# Patient Record
Sex: Female | Born: 1990 | Hispanic: No | Marital: Single | Smoking: Never smoker
Health system: Southern US, Community
[De-identification: ages and names within clinical notes are randomized; demographics above are authoritative.]

## PROBLEM LIST (undated history)

## (undated) DIAGNOSIS — F419 Anxiety disorder, unspecified: Secondary | ICD-10-CM

---

## 2016-07-12 ENCOUNTER — Emergency Department (HOSPITAL_COMMUNITY): Payer: Self-pay

## 2016-07-12 ENCOUNTER — Encounter (HOSPITAL_COMMUNITY): Payer: Self-pay

## 2016-07-12 ENCOUNTER — Emergency Department (HOSPITAL_COMMUNITY)
Admission: EM | Admit: 2016-07-12 | Discharge: 2016-07-12 | Disposition: A | Discharge status: Home / Self Care | Payer: Self-pay | Attending: Emergency Medicine | Admitting: Emergency Medicine

## 2016-07-12 DIAGNOSIS — R0789 Other chest pain: Secondary | ICD-10-CM | POA: Insufficient documentation

## 2016-07-12 DIAGNOSIS — Z79899 Other long term (current) drug therapy: Secondary | ICD-10-CM | POA: Insufficient documentation

## 2016-07-12 HISTORY — DX: Anxiety disorder, unspecified: F41.9

## 2016-07-12 LAB — RAPID URINE DRUG SCREEN, HOSP PERFORMED
Amphetamines: NOT DETECTED
Barbiturates: NOT DETECTED
Benzodiazepines: NOT DETECTED
COCAINE: NOT DETECTED
OPIATES: NOT DETECTED
Tetrahydrocannabinol: NOT DETECTED

## 2016-07-12 LAB — BASIC METABOLIC PANEL
Anion gap: 9 (ref 5–15)
BUN: 7 mg/dL (ref 6–20)
CALCIUM: 9.2 mg/dL (ref 8.9–10.3)
CO2: 22 mmol/L (ref 22–32)
CREATININE: 0.89 mg/dL (ref 0.44–1.00)
Chloride: 107 mmol/L (ref 101–111)
GFR calc Af Amer: >60 mL/min (ref >60)
GFR calc non Af Amer: >60 mL/min (ref >60)
GLUCOSE: 119 mg/dL — AB (ref 65–99)
Potassium: 3.9 mmol/L (ref 3.5–5.1)
Sodium: 138 mmol/L (ref 135–145)

## 2016-07-12 LAB — URINALYSIS, ROUTINE W REFLEX MICROSCOPIC
BILIRUBIN URINE: NEGATIVE
Glucose, UA: NEGATIVE mg/dL
HGB URINE DIPSTICK: NEGATIVE
Ketones, ur: NEGATIVE mg/dL
Leukocytes, UA: NEGATIVE
NITRITE: NEGATIVE
PROTEIN: NEGATIVE mg/dL
Specific Gravity, Urine: 1.015 (ref 1.005–1.030)
pH: 6 (ref 5.0–8.0)

## 2016-07-12 LAB — CBC
HCT: 42.5 % (ref 36.0–46.0)
HEMOGLOBIN: 14.1 g/dL (ref 12.0–15.0)
MCH: 27.5 pg (ref 26.0–34.0)
MCHC: 33.2 g/dL (ref 30.0–36.0)
MCV: 83 fL (ref 78.0–100.0)
Platelets: 229 10*3/uL (ref 150–400)
RBC: 5.12 MIL/uL — ABNORMAL HIGH (ref 3.87–5.11)
RDW: 13.3 % (ref 11.5–15.5)
WBC: 6.7 10*3/uL (ref 4.0–10.5)

## 2016-07-12 LAB — I-STAT TROPONIN, ED: TROPONIN I, POC: 0 ng/mL (ref 0.00–0.08)

## 2016-07-12 LAB — D-DIMER, QUANTITATIVE (NOT AT ARMC): D DIMER QUANT: 1.15 ug{FEU}/mL — AB (ref 0.00–0.50)

## 2016-07-12 LAB — POC URINE PREG, ED: Preg Test, Ur: NEGATIVE

## 2016-07-12 MED ORDER — HYDROXYZINE HCL 25 MG PO TABS: 25.0000 mg | ORAL_TABLET | Freq: Three times a day (TID) | ORAL | 0 refills | Status: AC | PRN

## 2016-07-12 MED ORDER — IOPAMIDOL (ISOVUE-370) INJECTION 76%
INTRAVENOUS | Status: AC
  Administered 2016-07-12: 65 mL via INTRAVENOUS
  Filled 2016-07-12: qty 100

## 2016-07-12 MED ORDER — PANTOPRAZOLE SODIUM 20 MG PO TBEC: 20.0000 mg | DELAYED_RELEASE_TABLET | Freq: Every day | ORAL | 0 refills | Status: AC

## 2016-07-12 MED ORDER — SODIUM CHLORIDE 0.9 % IV BOLUS (SEPSIS)
500.0000 mL | Freq: Once | INTRAVENOUS | Status: AC
  Administered 2016-07-12: 500 mL via INTRAVENOUS

## 2016-07-12 MED ORDER — GI COCKTAIL ~~LOC~~
30.0000 mL | Freq: Once | ORAL | Status: AC
  Administered 2016-07-12: 30 mL via ORAL
  Filled 2016-07-12: qty 30

## 2016-07-12 NOTE — ED Notes (Signed)
Ambulated on pulse oximetry.  Ambulated with steady gait, pulse 94, O2 saturation 99%.

## 2016-07-12 NOTE — ED Provider Notes (Signed)
[facility]-EMERGENCY DEPT Provider Note   CSN: [id number] Arrival date & time: 07/12/16  4098     History   Chief Complaint Chief Complaint  Patient presents with  . Chest Pain    HPI Charlene Ramirez is a 25 y.o. female.  HPI   Patient is a 25 year old female who presents emergency Department with right substernal chest pain described as pressure and squeezing that began last night at 10 PM, while at rest. Pain is severe, rated 8 out of 10, and waxed and waned until 2 AM when it became constant and severe with associated sweats, "shakiness" and frontal headache. She also reports intermittent nausea for the first 2 hours of pain and shortness of breath. She did become anxious after the pain became more constant. She denies any history of smoking. She denies cough, wheeze, fever, chills, orthopnea, PND, palpitations, lower extremity edema, near syncope, numbness or tingling.  She reports pain is not worse with inspiration, exertion, or positional changes.  She reports only past medical history of arthritis and is currently taking oral birth control. She denies any family history of cardiac disease.  Past Medical History:  Diagnosis Date  . Anxiety     There are no active problems to display for this patient.   History reviewed. No pertinent surgical history.  OB History    No data available       Home Medications    Prior to Admission medications   Medication Sig Start Date End Date Taking? Authorizing Provider  PRESCRIPTION MEDICATION Take 1 tablet by mouth daily. Birth control tablet ( 28 day pack)   Yes Historical Provider, MD  hydrOXYzine (ATARAX/VISTARIL) 25 MG tablet Take 1 tablet (25 mg total) by mouth every 8 (eight) hours as needed for anxiety. 07/12/16   Danelle Berry, PA-C  pantoprazole (PROTONIX) 20 MG tablet Take 1 tablet (20 mg total) by mouth daily. 07/12/16   Danelle Berry, PA-C    Family History History reviewed. No pertinent family history.  Social History Social  History  Substance Use Topics  . Smoking status: Never Smoker  . Smokeless tobacco: Never Used  . Alcohol use No     Allergies   Review of patient's allergies indicates no known allergies.   Review of Systems Review of Systems  All other systems reviewed and are negative.    Physical Exam Updated Vital Signs BP 116/76 (BP Location: Left Arm)   Pulse 84   Temp 98 F (36.7 C) (Oral)   Resp 25   Ht 5\' 9"  (1.753 m)   Wt 93 kg   LMP 05/19/2016 (Within Weeks)   SpO2 98%   BMI 30.27 kg/m   Physical Exam  Constitutional: She is oriented to person, place, and time. She appears well-developed and well-nourished.  Non-toxic appearance. She does not have a sickly appearance. She does not appear ill. No distress.  HENT:  Head: Normocephalic and atraumatic.  Nose: Nose normal.  Mouth/Throat: Oropharynx is clear and moist. No oropharyngeal exudate.  Eyes: Conjunctivae and EOM are normal. Pupils are equal, round, and reactive to light. Right eye exhibits no discharge. Left eye exhibits no discharge. No scleral icterus.  Neck: Normal range of motion. Neck supple. No JVD present. No tracheal deviation present. No thyromegaly present.  Cardiovascular: Normal rate, regular rhythm, normal heart sounds and intact distal pulses.  Exam reveals no gallop and no friction rub.   No murmur heard. Pulses:      Radial pulses are 2+ on the right side,  and 2+ on the left side.       Dorsalis pedis pulses are 2+ on the right side, and 2+ on the left side.       Posterior tibial pulses are 2+ on the right side, and 2+ on the left side.    No lower extremity edema  Pulmonary/Chest: Effort normal and breath sounds normal. No accessory muscle usage or stridor. No tachypnea. No respiratory distress. She has no decreased breath sounds. She has no wheezes. She has no rhonchi. She has no rales. She exhibits no tenderness.  Abdominal: Soft. Bowel sounds are normal. She exhibits no distension and no mass.  There is no tenderness. There is no rebound and no guarding.  Musculoskeletal: Normal range of motion. She exhibits no edema or tenderness.  Lymphadenopathy:    She has no cervical adenopathy.  Neurological: She is alert and oriented to person, place, and time. She exhibits normal muscle tone. Coordination normal.  Skin: Skin is warm and dry. Capillary refill takes less than 2 seconds. No rash noted. She is not diaphoretic. No erythema. No pallor.  Psychiatric: She has a normal mood and affect. Her behavior is normal. Judgment and thought content normal.  Nursing note and vitals reviewed.    ED Treatments / Results  Labs (all labs ordered are listed, but only abnormal results are displayed) Labs Reviewed  BASIC METABOLIC PANEL - Abnormal; Notable for the following:       Result Value   Glucose, Bld 119 (*)    All other components within normal limits  CBC - Abnormal; Notable for the following:    RBC 5.12 (*)    All other components within normal limits  D-DIMER, QUANTITATIVE (NOT AT [facility]) - Abnormal; Notable for the following:    D-Dimer, Quant 1.15 (*)    All other components within normal limits  URINALYSIS, ROUTINE W REFLEX MICROSCOPIC (NOT AT [facility])  URINE RAPID DRUG SCREEN, HOSP PERFORMED  I-STAT TROPOININ, ED  POC URINE PREG, ED    EKG  EKG Interpretation  Date/Time:  Monday July 12 2016 06:38:39 EDT Ventricular Rate:  96 PR Interval:  148 QRS Duration: 86 QT Interval:  358 QTC Calculation: 452 R Axis:   96 Text Interpretation:  Normal sinus rhythm Rightward axis Borderline ECG No prior for comparison Confirmed by Wilkie Aye  MD, Toni Amend ([id number]) on 07/12/2016 6:38:12 AM Also confirmed by Wilkie Aye  MD, COURTNEY ([id number]), editor [place]  CCT, BEVERLY ([id number])  on 07/12/2016 7:10:54 AM       Radiology Dg Chest 2 View  Result Date: 07/12/2016 CLINICAL DATA:  Right-sided chest pain since last night. No reported cardiopulmonary history, nonsmoker. EXAM: CHEST  2 VIEW  COMPARISON:  None in PACs FINDINGS: The lungs are adequately inflated and clear. The heart and pulmonary vascularity are normal. The mediastinum is normal in width. There is no pleural effusion. The bony thorax exhibits no acute abnormality. IMPRESSION: There is no active cardiopulmonary disease. Electronically Signed   By: David  [place] M.D.   On: 07/12/2016 07:25   Ct Angio Chest Pe W And/or Wo Contrast  Result Date: 07/12/2016 CLINICAL DATA:  Right chest pain, shortness of breath EXAM: CT ANGIOGRAPHY CHEST WITH CONTRAST TECHNIQUE: Multidetector CT imaging of the chest was performed using the standard protocol during bolus administration of intravenous contrast. Multiplanar CT image reconstructions and MIPs were obtained to evaluate the vascular anatomy. CONTRAST:  65 mL Isovue 370 IV COMPARISON:  Chest radiographs dated 07/12/2016. FINDINGS: Cardiovascular: Satisfactory opacification of the  pulmonary arteries to the segmental level. No evidence of pulmonary embolism. Normal heart size. No pericardial effusion. Mediastinum/Nodes: No enlarged mediastinal, hilar, or axillary lymph nodes. Thyroid gland, trachea, and esophagus demonstrate no significant findings. Lungs/Pleura: Lungs are clear. No pleural effusion or pneumothorax. Upper Abdomen: No acute abnormality. Musculoskeletal: No chest wall abnormality. No acute or significant osseous findings. Review of the MIP images confirms the above findings. IMPRESSION: No evidence of pulmonary embolism. Normal CT chest. Electronically Signed   By: Charline BillsSriyesh  Krishnan M.D.   On: 07/12/2016 10:32    Procedures Procedures (including critical care time)  Medications Ordered in ED Medications  gi cocktail (Maalox,Lidocaine,Donnatal) (30 mLs Oral Given 07/12/16 0756)  sodium chloride 0.9 % bolus 500 mL (0 mLs Intravenous Stopped 07/12/16 1057)  iopamidol (ISOVUE-370) 76 % injection (65 mLs Intravenous Contrast Given 07/12/16 0952)     Initial Impression /  Assessment and Plan / ED Course  I have reviewed the triage vital signs and the nursing notes.  Pertinent labs & imaging results that were available during my care of the patient were reviewed by me and considered in my medical decision making (see chart for details).  Clinical Course  25 year old female with right-sided substernal chest pain began last night at 10 PM, onset at rest, waxed and waned for 4 hours and then became constant.  CP w/up initiated.  Patient is low risk heart score of 1.  Doubt ACS.  Troponin negative. Cannot PERC because of birth control, d-dimer added which was positive, CT angio of chest obtained to rule out PE.  CT angio of chest was negative, EKG normal sinus rhythm, nonischemic, right axis deviation.  Chest x-ray negative.  Will initiate PPI trial.  Patient endorses anxiety, will give Vistaril. All results and plan to discharge home with medications reviewed patient, who is in agreement with plan. She verbalizes understanding of return precautions.  She was discharged home in good condition with stable vital signs.  Final Clinical Impressions(s) / ED Diagnoses   Final diagnoses:  Atypical chest pain    New Prescriptions Discharge Medication List as of 07/12/2016 10:47 AM    START taking these medications   Details  hydrOXYzine (ATARAX/VISTARIL) 25 MG tablet Take 1 tablet (25 mg total) by mouth every 8 (eight) hours as needed for anxiety., Starting Mon 07/12/2016, Print    pantoprazole (PROTONIX) 20 MG tablet Take 1 tablet (20 mg total) by mouth daily., Starting Mon 07/12/2016, Print         Danelle BerryLeisa Corrinne Benegas, PA-C 07/12/16 1639    Rolland PorterMark James, MD 07/16/16 [phone]

## 2016-07-12 NOTE — ED Notes (Signed)
Patient transported to X-ray 

## 2016-07-12 NOTE — ED Triage Notes (Signed)
Pt presents with CP that started last night. Pt rates 8-10 pain. Mild SOB lung sounds clear.

## 2016-09-03 ENCOUNTER — Inpatient Hospital Stay: Payer: Self-pay

## 2016-09-15 ENCOUNTER — Encounter: Payer: Self-pay | Admitting: Physician Assistant

## 2016-09-15 ENCOUNTER — Ambulatory Visit: Payer: Self-pay | Attending: Internal Medicine | Admitting: Physician Assistant

## 2016-09-15 VITALS — BP 115/79 | HR 76 | Temp 98.2°F | Wt 206.2 lb

## 2016-09-15 DIAGNOSIS — Z0001 Encounter for general adult medical examination with abnormal findings: Secondary | ICD-10-CM | POA: Insufficient documentation

## 2016-09-15 DIAGNOSIS — F411 Generalized anxiety disorder: Secondary | ICD-10-CM | POA: Insufficient documentation

## 2016-09-15 NOTE — Progress Notes (Signed)
Charlene JewMyranda Hayduk  ZOX:[id number]  WJX:[id number]  DOB - 08-27-1991  Chief Complaint  Patient presents with  . Hospitalization Follow-up    anxiety       Subjective:   Charlene Ramirez is a 25 y.o. female with a PMHx of anxiety only here today for establishment of care. She presented to the ED on 07/12/16 with right sided CP that had been progressive over the 2 weeks prior. Throbbing, pressure like. Assoc HA, N and diaphoresis. VSS. Labs ok. Chest xray and CT angio ok. EKG NSR. Given GI cocktail and script for Vistaril and PPI.   She did not get either med filled. States she believes that her sxs are related to her anxiety. Previously had a service dog. Needs a new one. Never  Medicated for anxiety in past and does not want any meds currently.   Has had a few episodes of right sided CP since the ED visit. Wants our assistance with obtaining a new therapy dog.    ROS: GEN: denies fever or chills, denies change in weight Skin: denies lesions or rashes HEENT: denies headache, earache, epistaxis, sore throat, or neck pain LUNGS: denies SHOB, dyspnea, PND, orthopnea CV: denies CP or palpitations ABD: denies abd pain, N or V EXT: denies muscle spasms or swelling; no pain in lower ext, no weakness NEURO: denies numbness or tingling, denies sz, stroke or TIA  ALLERGIES: No Known Allergies  PAST MEDICAL HISTORY: Past Medical History:  Diagnosis Date  . Anxiety     PAST SURGICAL HISTORY: No past surgical history on file.  MEDICATIONS AT HOME: Prior to Admission medications   Medication Sig Start Date End Date Taking? Authorizing Provider  PRESCRIPTION MEDICATION Take 1 tablet by mouth daily. Birth control tablet ( 28 day pack)   Yes Historical Provider, MD  hydrOXYzine (ATARAX/VISTARIL) 25 MG tablet Take 1 tablet (25 mg total) by mouth every 8 (eight) hours as needed for anxiety. Patient not taking: Reported on 09/15/2016 07/12/16   Danelle BerryLeisa Tapia, PA-C  pantoprazole (PROTONIX) 20 MG  tablet Take 1 tablet (20 mg total) by mouth daily. Patient not taking: Reported on 09/15/2016 07/12/16   Danelle BerryLeisa Tapia, PA-C     Objective:   Vitals:   09/15/16 0918  BP: 115/79  Pulse: 76  Temp: 98.2 F (36.8 C)  TempSrc: Oral  SpO2: 99%  Weight: 206 lb 3.2 oz (93.5 kg)    Exam General appearance : Awake, alert, not in any distress. Speech Clear. Not toxic looking HEENT: Atraumatic and Normocephalic, pupils equally reactive to light and accomodation Neck: supple, no JVD. No cervical lymphadenopathy.  Chest:Good air entry bilaterally, no added sounds  CVS: S1 S2 regular, no murmurs.  Abdomen: Bowel sounds present, Non tender and not distended with no gaurding, rigidity or rebound. Extremities: B/L Lower Ext shows no edema, both legs are warm to touch Neurology: Awake alert, and oriented X 3, CN II-XII intact, Non focal Skin:No Rash Wounds:N/A  Data Review No results found for: HGBA1C   Assessment & Plan  1. Atypical CP likely related to # 2  2. GAD  -declines meds or SW assistance   -letter for therapy dog provided     Return in about 1 month (around 10/15/2016). For routine health maintenance.  The patient was given clear instructions to go to ER or return to medical center if symptoms don't improve, worsen or new problems develop. The patient verbalized understanding. The patient was told to call to get lab results if they haven't  heard anything in the next week.   This note has been created with Education officer, environmentalDragon speech recognition software and smart phrase technology. Any transcriptional errors are unintentional.    Scot Juniffany Hawthorne Day, PA-C [facility] [place], [place] [phone]   09/15/2016, 9:37 AM

## 2017-03-10 IMAGING — CR DG CHEST 2V
2 series · 2 of 2 positions shown · non-contrast
Comparison: None in PACs

CLINICAL DATA: Right-sided chest pain since last night. No reported
cardiopulmonary history, nonsmoker.

EXAM:
CHEST  2 VIEW

[chest pa]
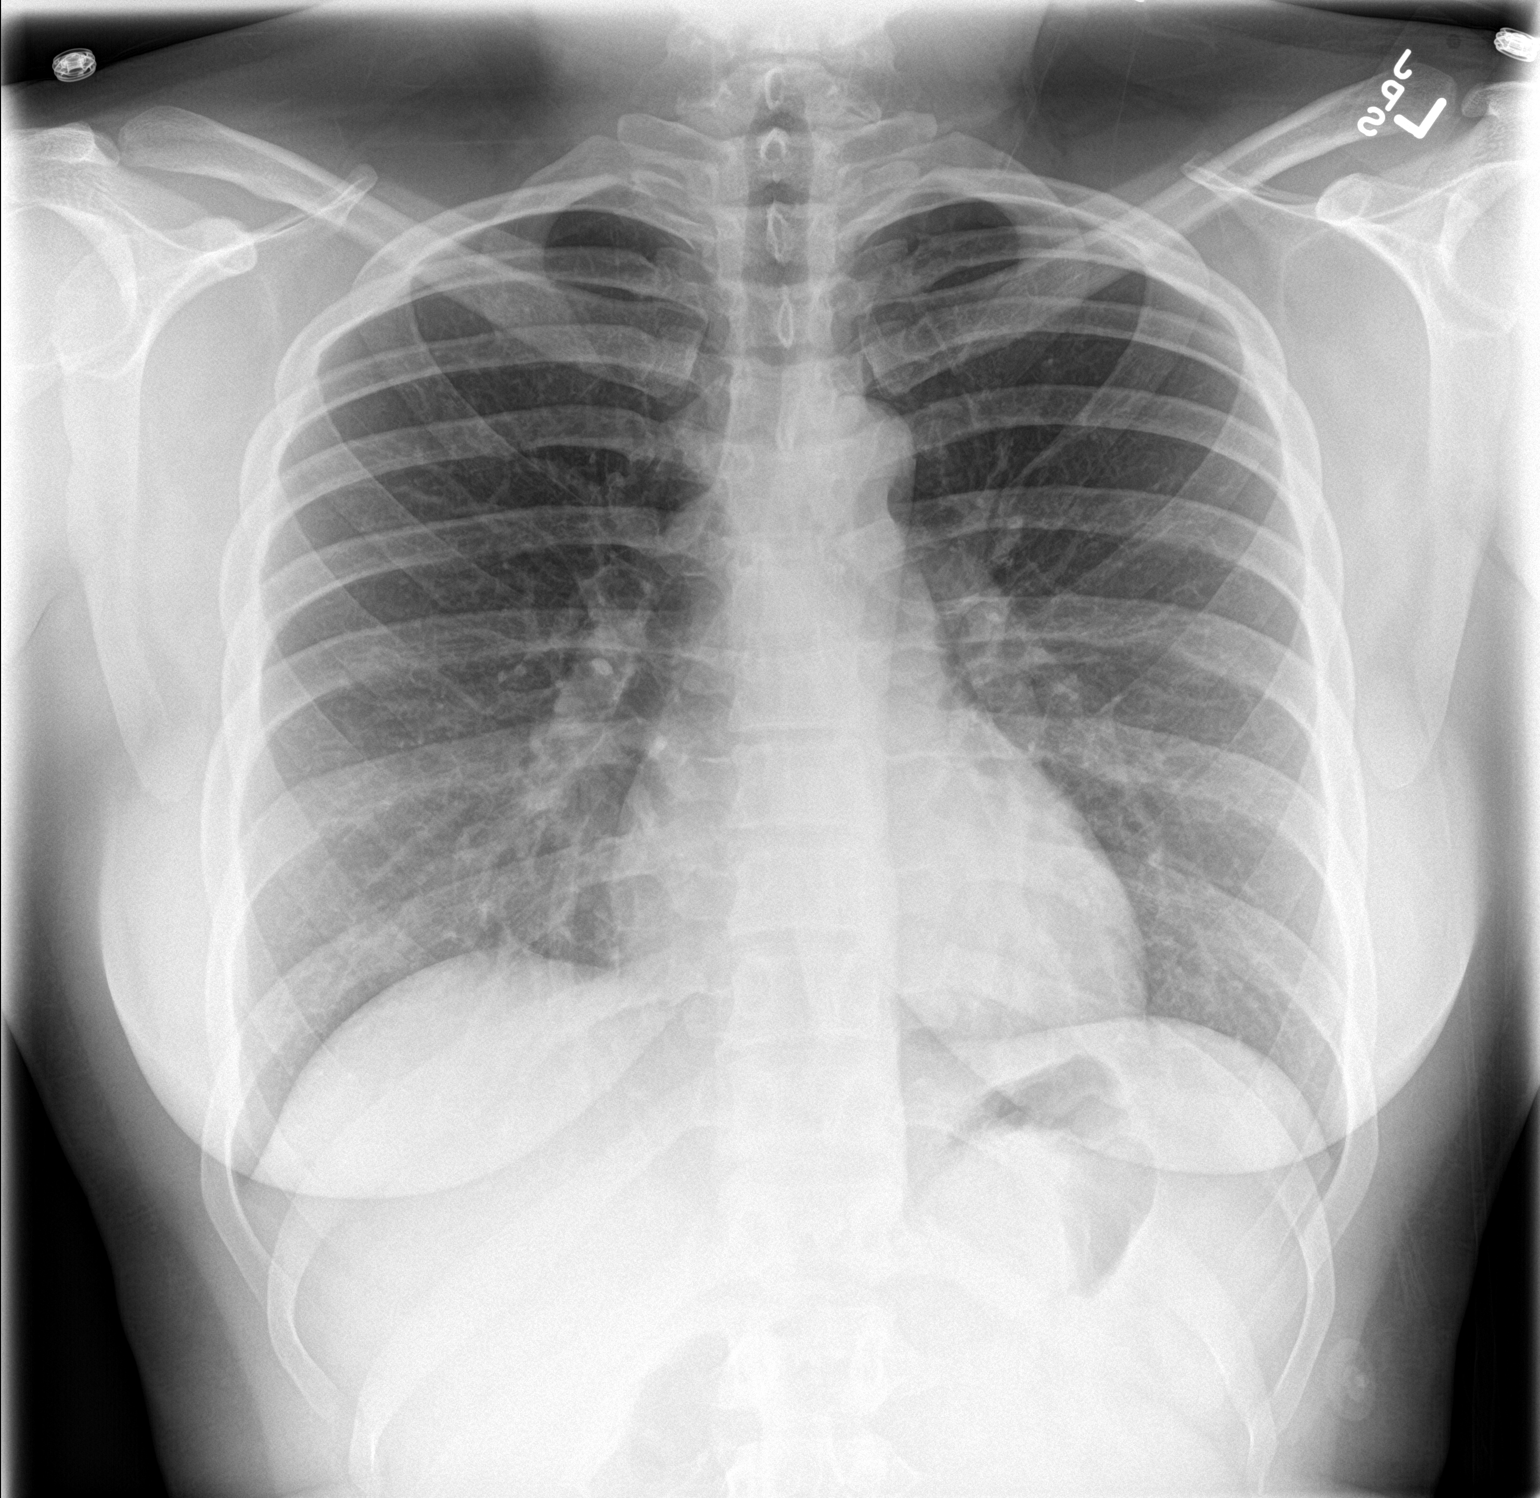

[chest lat]
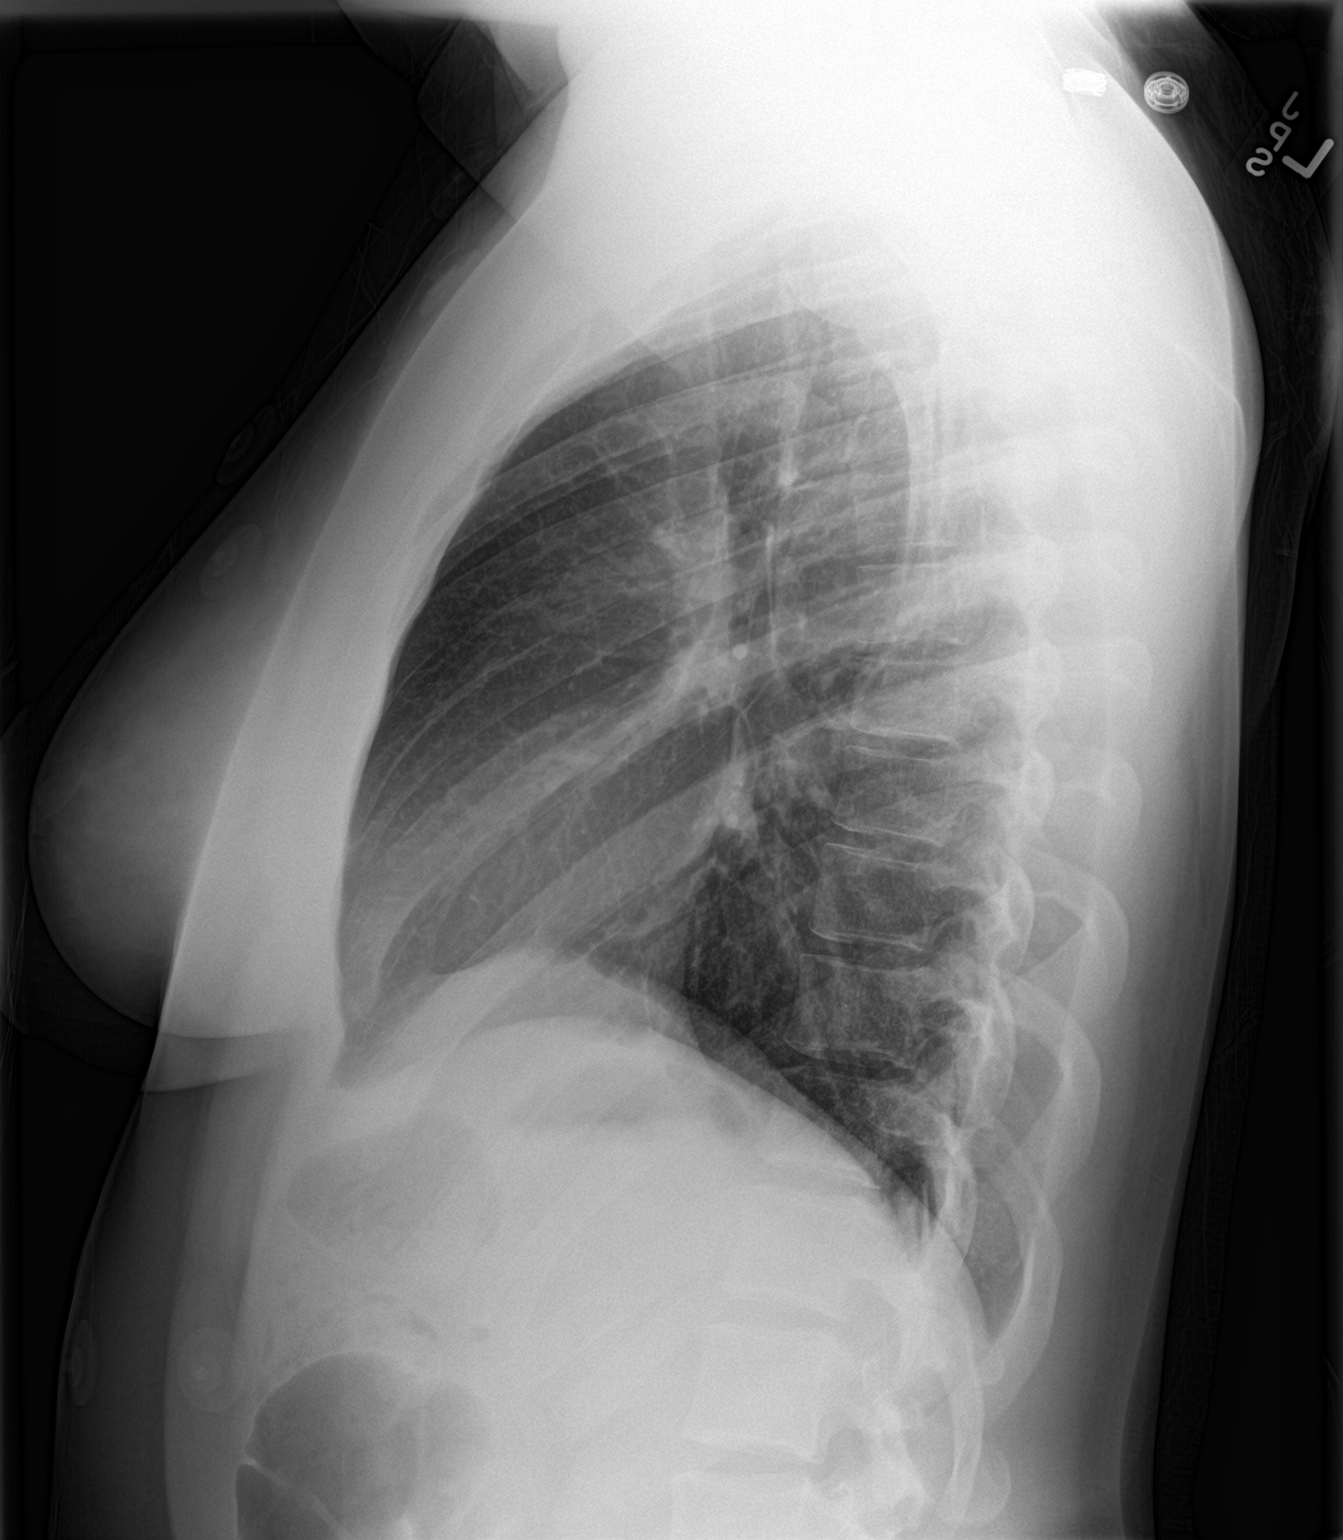

[2 of 2 positions shown; findings below may reference images not displayed]

FINDINGS: The lungs are adequately inflated and clear. The heart and pulmonary
vascularity are normal. The mediastinum is normal in width. There is
no pleural effusion. The bony thorax exhibits no acute abnormality.
IMPRESSION: There is no active cardiopulmonary disease.

## 2017-03-10 IMAGING — CT CT ANGIO CHEST
2 of 9 series · 19 of 46 positions shown · IV contrast (OMNI)
Comparison: Chest radiographs dated 07/12/2016.

CLINICAL DATA: Right chest pain, shortness of breath

EXAM:
CT ANGIOGRAPHY CHEST WITH CONTRAST
TECHNIQUE: Multidetector CT imaging of the chest was performed using the
standard protocol during bolus administration of intravenous
contrast. Multiplanar CT image reconstructions and MIPs were
obtained to evaluate the vascular anatomy.
CONTRAST:  65 mL Isovue 370 IV

[Series 5: thins · axial · 0.67mm/px · z∈[+1308,+1497]mm · 16 of 213 slices shown]
[im 12/213  lung]
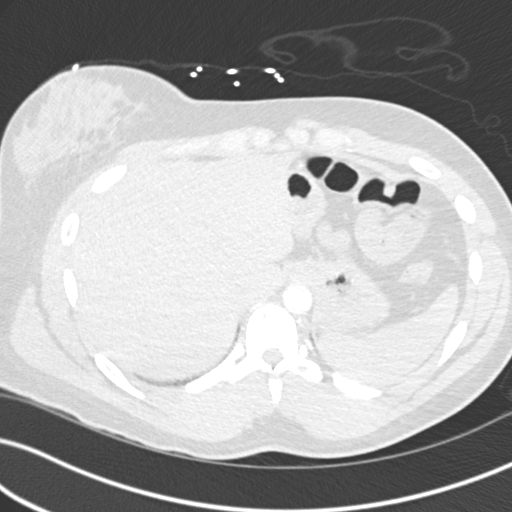
[im 24/213  soft-tissue]
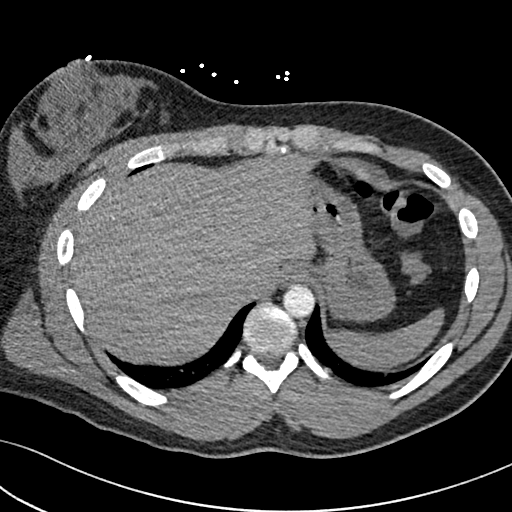
[im 36/213  lung]
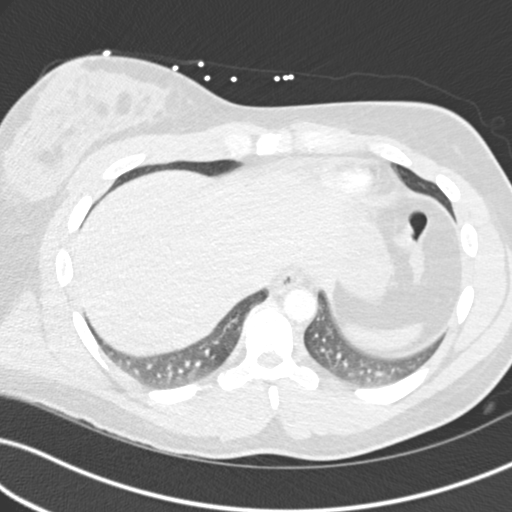
[im 48/213  soft-tissue]
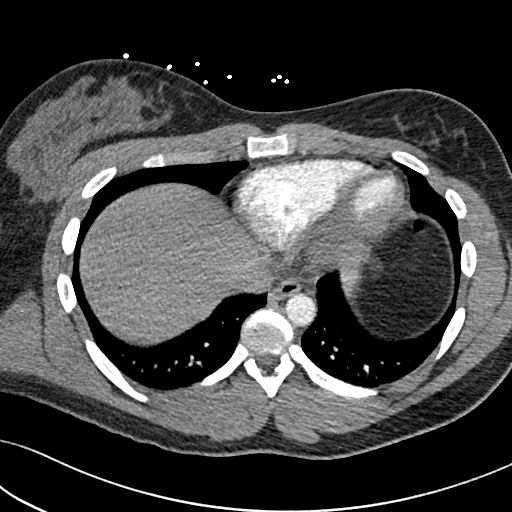
[im 59/213  lung]
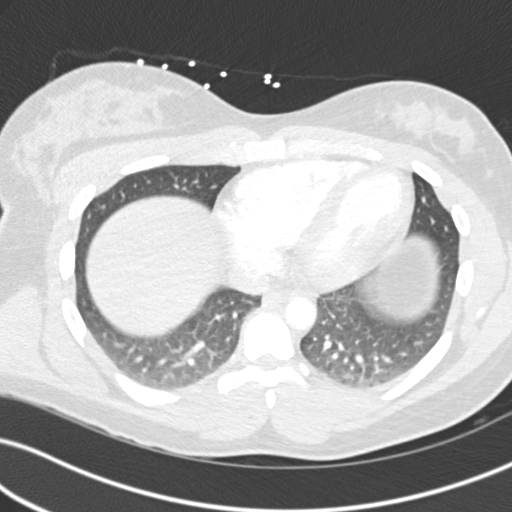
[im 71/213  soft-tissue]
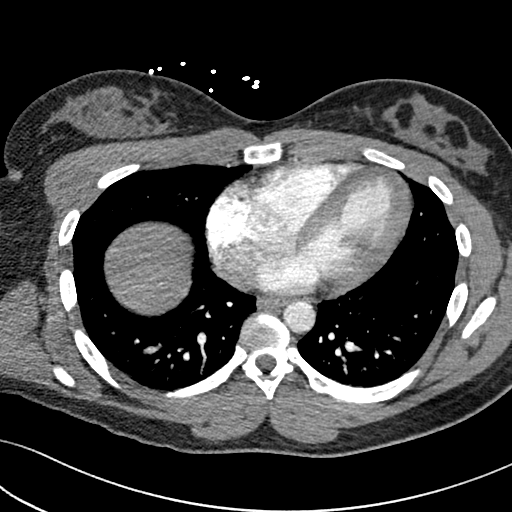
[im 83/213  lung]
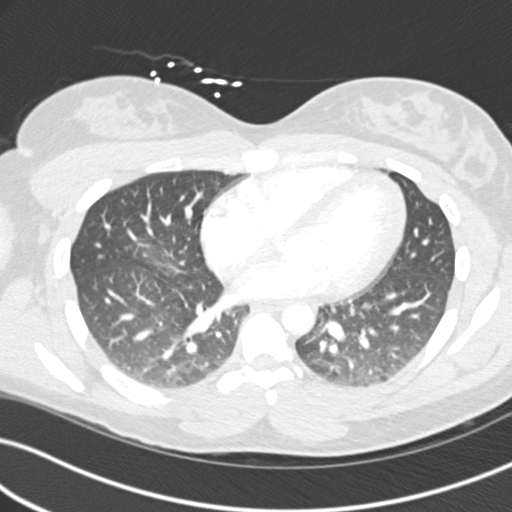
[im 95/213  soft-tissue]
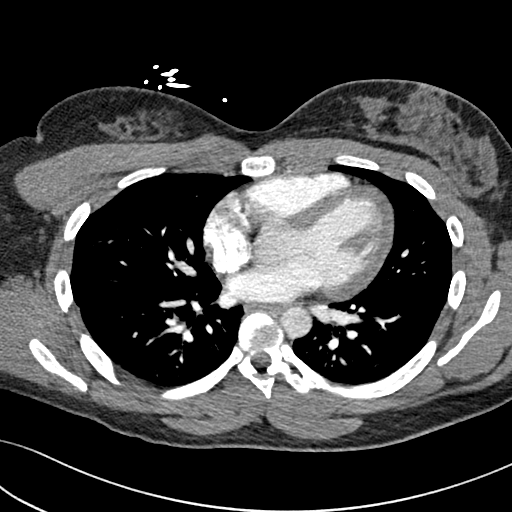
[im 118/213  lung]
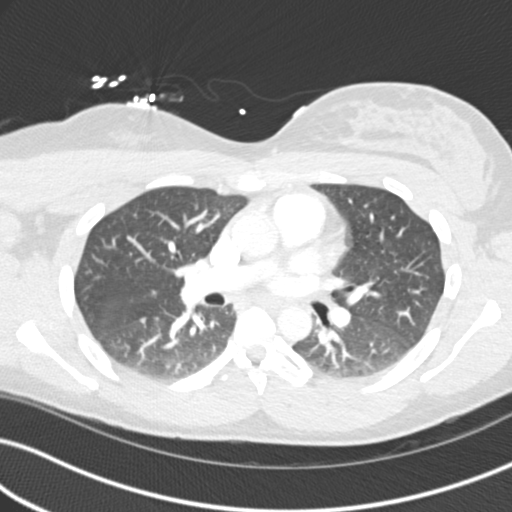
[im 130/213  soft-tissue]
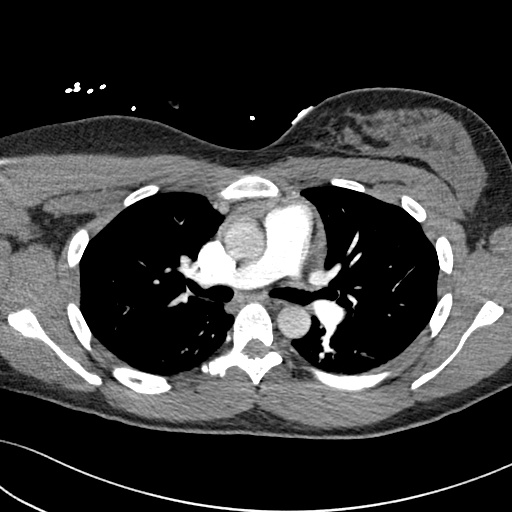
[im 142/213  lung]
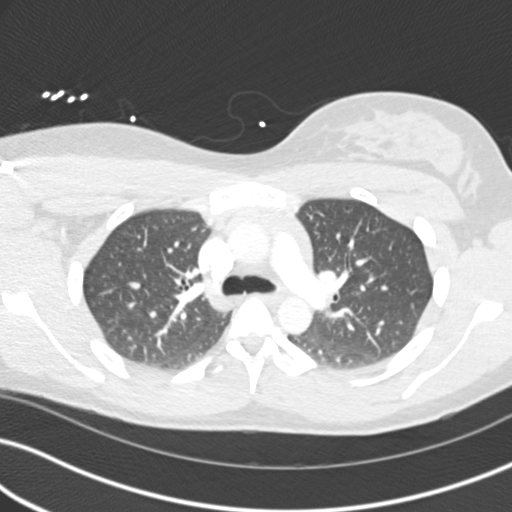
[im 154/213  soft-tissue]
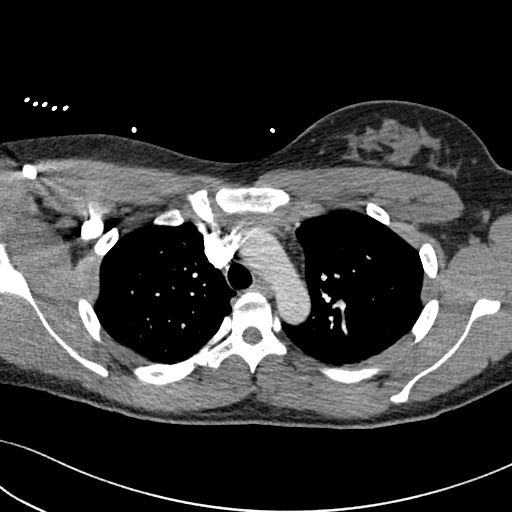
[im 165/213  lung]
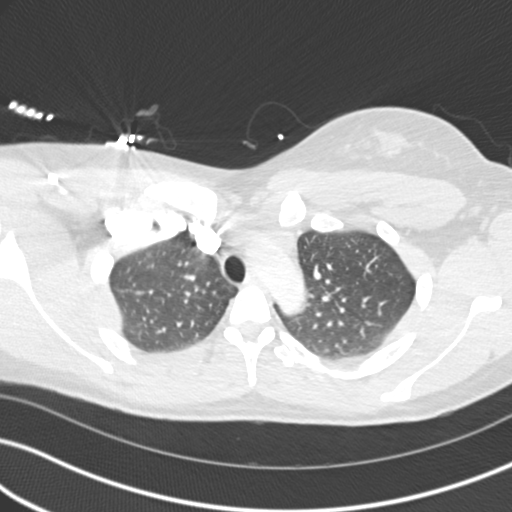
[im 177/213  soft-tissue]
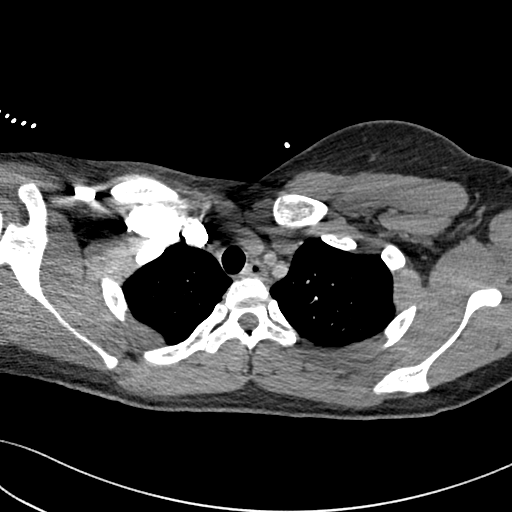
[im 189/213  lung]
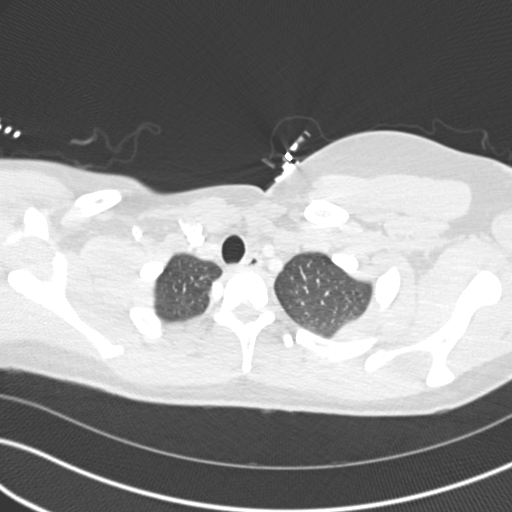
[im 201/213  soft-tissue]
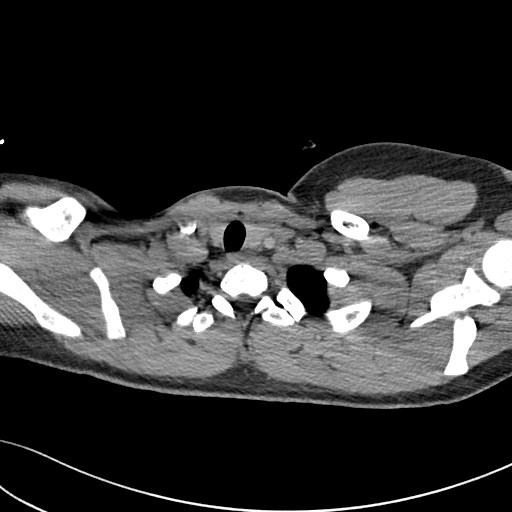

[Series 7: coronal mpr · coronal · 0.50mm/px · 3 of 151 slices shown]
[im 38/151  soft-tissue]
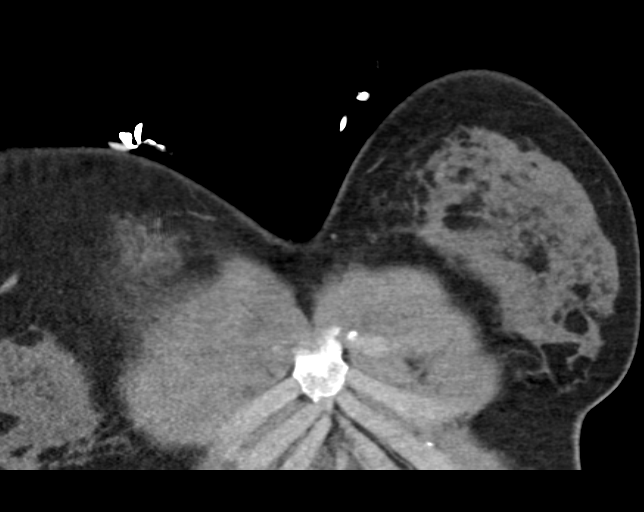
[im 76/151  soft-tissue]
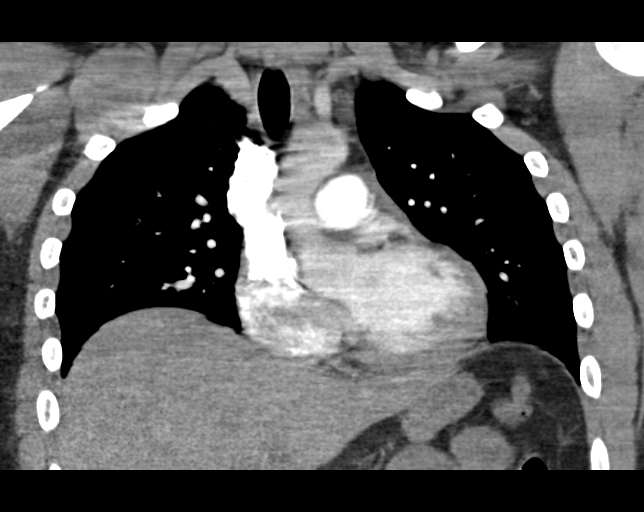
[im 113/151  soft-tissue]
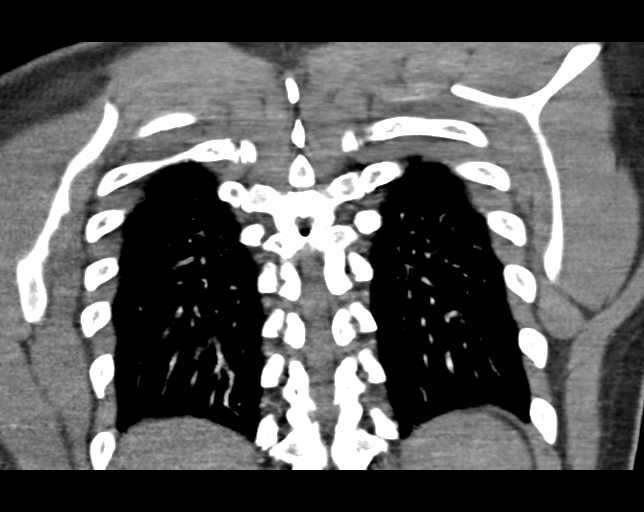

[19 of 46 positions shown; findings below may reference images not displayed]

FINDINGS: Cardiovascular: Satisfactory opacification of the pulmonary arteries
to the segmental level. No evidence of pulmonary embolism. Normal
heart size. No pericardial effusion.

Mediastinum/Nodes: No enlarged mediastinal, hilar, or axillary lymph
nodes. Thyroid gland, trachea, and esophagus demonstrate no
significant findings.

Lungs/Pleura: Lungs are clear. No pleural effusion or pneumothorax.

Upper Abdomen: No acute abnormality.

Musculoskeletal: No chest wall abnormality. No acute or significant
osseous findings.

Review of the MIP images confirms the above findings.
IMPRESSION: No evidence of pulmonary embolism.

Normal CT chest.
